# Patient Record
Sex: Female | Born: 1980 | Race: White | Hispanic: No | Marital: Single | Smoking: Current every day smoker
Health system: Southern US, Community
[De-identification: ages and names within clinical notes are randomized; demographics above are authoritative.]

## PROBLEM LIST (undated history)

## (undated) DIAGNOSIS — R569 Unspecified convulsions: Secondary | ICD-10-CM

## (undated) DIAGNOSIS — I639 Cerebral infarction, unspecified: Secondary | ICD-10-CM

## (undated) HISTORY — PX: OTHER SURGICAL HISTORY: SHX169

---
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
## 1999-12-21 ENCOUNTER — Emergency Department (HOSPITAL_COMMUNITY): Admission: EM | Admit: 1999-12-21 | Discharge: 1999-12-21 | Payer: Self-pay

## 2001-01-21 ENCOUNTER — Emergency Department (HOSPITAL_COMMUNITY): Admission: EM | Admit: 2001-01-21 | Discharge: 2001-01-21 | Payer: Self-pay | Admitting: Emergency Medicine

## 2001-03-28 ENCOUNTER — Encounter: Payer: Self-pay | Admitting: Obstetrics and Gynecology

## 2001-03-28 ENCOUNTER — Inpatient Hospital Stay (HOSPITAL_COMMUNITY): Admission: AD | Admit: 2001-03-28 | Discharge: 2001-03-28 | Payer: Self-pay | Admitting: Obstetrics

## 2001-04-06 ENCOUNTER — Other Ambulatory Visit: Admission: RE | Admit: 2001-04-06 | Discharge: 2001-04-06 | Payer: Self-pay | Admitting: Obstetrics & Gynecology

## 2001-06-26 ENCOUNTER — Ambulatory Visit (HOSPITAL_COMMUNITY): Admission: RE | Admit: 2001-06-26 | Discharge: 2001-06-26 | Payer: Self-pay | Admitting: Obstetrics & Gynecology

## 2001-06-26 ENCOUNTER — Encounter: Payer: Self-pay | Admitting: Obstetrics & Gynecology

## 2001-11-22 ENCOUNTER — Inpatient Hospital Stay (HOSPITAL_COMMUNITY): Admission: RE | Admit: 2001-11-22 | Discharge: 2001-11-26 | Payer: Self-pay | Admitting: Obstetrics & Gynecology

## 2002-04-21 ENCOUNTER — Emergency Department (HOSPITAL_COMMUNITY): Admission: EM | Admit: 2002-04-21 | Discharge: 2002-04-21 | Payer: Self-pay | Admitting: Emergency Medicine

## 2003-03-19 ENCOUNTER — Inpatient Hospital Stay (HOSPITAL_COMMUNITY): Admission: AD | Admit: 2003-03-19 | Discharge: 2003-03-19 | Payer: Self-pay | Admitting: *Deleted

## 2003-03-27 ENCOUNTER — Inpatient Hospital Stay (HOSPITAL_COMMUNITY): Admission: AD | Admit: 2003-03-27 | Discharge: 2003-03-27 | Payer: Self-pay | Admitting: Obstetrics & Gynecology

## 2003-06-03 ENCOUNTER — Inpatient Hospital Stay (HOSPITAL_COMMUNITY): Admission: AD | Admit: 2003-06-03 | Discharge: 2003-06-04 | Payer: Self-pay | Admitting: *Deleted

## 2003-08-12 ENCOUNTER — Ambulatory Visit (HOSPITAL_COMMUNITY): Admission: RE | Admit: 2003-08-12 | Discharge: 2003-08-12 | Payer: Self-pay | Admitting: Obstetrics & Gynecology

## 2003-10-01 ENCOUNTER — Inpatient Hospital Stay (HOSPITAL_COMMUNITY): Admission: AD | Admit: 2003-10-01 | Discharge: 2003-10-01 | Payer: Self-pay | Admitting: Obstetrics

## 2003-10-16 ENCOUNTER — Inpatient Hospital Stay (HOSPITAL_COMMUNITY): Admission: RE | Admit: 2003-10-16 | Discharge: 2003-10-19 | Payer: Self-pay | Admitting: Obstetrics & Gynecology

## 2003-10-31 ENCOUNTER — Emergency Department (HOSPITAL_COMMUNITY): Admission: EM | Admit: 2003-10-31 | Discharge: 2003-10-31 | Payer: Self-pay | Admitting: *Deleted

## 2003-11-10 ENCOUNTER — Emergency Department (HOSPITAL_COMMUNITY): Admission: EM | Admit: 2003-11-10 | Discharge: 2003-11-10 | Payer: Self-pay | Admitting: Emergency Medicine

## 2003-11-16 ENCOUNTER — Emergency Department (HOSPITAL_COMMUNITY): Admission: EM | Admit: 2003-11-16 | Discharge: 2003-11-16 | Payer: Self-pay | Admitting: Emergency Medicine

## 2003-11-17 ENCOUNTER — Inpatient Hospital Stay (HOSPITAL_COMMUNITY): Admission: RE | Admit: 2003-11-17 | Discharge: 2003-11-19 | Payer: Self-pay | Admitting: Orthopaedic Surgery

## 2005-06-18 ENCOUNTER — Inpatient Hospital Stay (HOSPITAL_COMMUNITY): Admission: EM | Admit: 2005-06-18 | Discharge: 2005-06-22 | Payer: Self-pay | Admitting: Emergency Medicine

## 2005-06-20 ENCOUNTER — Encounter (INDEPENDENT_AMBULATORY_CARE_PROVIDER_SITE_OTHER): Payer: Self-pay | Admitting: Cardiology

## 2005-06-22 ENCOUNTER — Ambulatory Visit: Payer: Self-pay | Admitting: Physical Medicine & Rehabilitation

## 2005-06-22 ENCOUNTER — Inpatient Hospital Stay (HOSPITAL_COMMUNITY)
Admission: RE | Admit: 2005-06-22 | Discharge: 2005-07-05 | Payer: Self-pay | Admitting: Physical Medicine & Rehabilitation

## 2005-08-12 ENCOUNTER — Ambulatory Visit: Payer: Self-pay | Admitting: Physical Medicine & Rehabilitation

## 2005-08-12 ENCOUNTER — Encounter
Admission: RE | Admit: 2005-08-12 | Discharge: 2005-11-10 | Payer: Self-pay | Admitting: Physical Medicine & Rehabilitation

## 2005-09-06 ENCOUNTER — Ambulatory Visit (HOSPITAL_COMMUNITY): Admission: RE | Admit: 2005-09-06 | Discharge: 2005-09-07 | Payer: Self-pay | Admitting: Cardiology

## 2005-09-22 ENCOUNTER — Ambulatory Visit: Payer: Self-pay | Admitting: Physical Medicine & Rehabilitation

## 2005-12-22 ENCOUNTER — Ambulatory Visit: Payer: Self-pay | Admitting: Physical Medicine & Rehabilitation

## 2005-12-22 ENCOUNTER — Encounter
Admission: RE | Admit: 2005-12-22 | Discharge: 2006-03-22 | Payer: Self-pay | Admitting: Physical Medicine & Rehabilitation

## 2006-03-21 ENCOUNTER — Ambulatory Visit: Payer: Self-pay | Admitting: Physical Medicine & Rehabilitation

## 2006-03-21 ENCOUNTER — Encounter
Admission: RE | Admit: 2006-03-21 | Discharge: 2006-06-19 | Payer: Self-pay | Admitting: Physical Medicine & Rehabilitation

## 2006-09-05 ENCOUNTER — Encounter
Admission: RE | Admit: 2006-09-05 | Discharge: 2006-12-04 | Payer: Self-pay | Admitting: Physical Medicine & Rehabilitation

## 2006-09-05 ENCOUNTER — Ambulatory Visit: Payer: Self-pay | Admitting: Physical Medicine & Rehabilitation

## 2006-11-15 ENCOUNTER — Ambulatory Visit: Payer: Self-pay | Admitting: Physical Medicine & Rehabilitation

## 2007-01-05 ENCOUNTER — Encounter
Admission: RE | Admit: 2007-01-05 | Discharge: 2007-04-05 | Payer: Self-pay | Admitting: Physical Medicine & Rehabilitation

## 2007-02-13 ENCOUNTER — Ambulatory Visit: Payer: Self-pay | Admitting: Physical Medicine & Rehabilitation

## 2007-04-12 ENCOUNTER — Ambulatory Visit: Payer: Self-pay | Admitting: Physical Medicine & Rehabilitation

## 2007-04-12 ENCOUNTER — Encounter
Admission: RE | Admit: 2007-04-12 | Discharge: 2007-04-16 | Payer: Self-pay | Admitting: Physical Medicine & Rehabilitation

## 2007-10-01 ENCOUNTER — Encounter
Admission: RE | Admit: 2007-10-01 | Discharge: 2007-10-01 | Payer: Self-pay | Admitting: Physical Medicine & Rehabilitation

## 2008-01-17 ENCOUNTER — Encounter
Admission: RE | Admit: 2008-01-17 | Discharge: 2008-01-17 | Payer: Self-pay | Admitting: Physical Medicine & Rehabilitation

## 2008-11-21 ENCOUNTER — Ambulatory Visit: Payer: Self-pay | Admitting: Family

## 2008-11-21 ENCOUNTER — Inpatient Hospital Stay (HOSPITAL_COMMUNITY): Admission: AD | Admit: 2008-11-21 | Discharge: 2008-11-21 | Payer: Self-pay | Admitting: Obstetrics & Gynecology

## 2008-11-25 ENCOUNTER — Emergency Department (HOSPITAL_COMMUNITY): Admission: EM | Admit: 2008-11-25 | Discharge: 2008-11-25 | Payer: Self-pay | Admitting: Emergency Medicine

## 2008-12-01 ENCOUNTER — Inpatient Hospital Stay (HOSPITAL_COMMUNITY): Admission: AD | Admit: 2008-12-01 | Discharge: 2008-12-01 | Payer: Self-pay | Admitting: Family Medicine

## 2009-06-16 ENCOUNTER — Ambulatory Visit: Payer: Self-pay | Admitting: Obstetrics and Gynecology

## 2009-06-16 ENCOUNTER — Inpatient Hospital Stay (HOSPITAL_COMMUNITY): Admission: AD | Admit: 2009-06-16 | Discharge: 2009-06-16 | Payer: Self-pay | Admitting: Obstetrics & Gynecology

## 2009-07-21 ENCOUNTER — Inpatient Hospital Stay (HOSPITAL_COMMUNITY): Admission: AD | Admit: 2009-07-21 | Discharge: 2009-07-21 | Payer: Self-pay | Admitting: Obstetrics and Gynecology

## 2009-08-05 ENCOUNTER — Encounter: Payer: Self-pay | Admitting: Physician Assistant

## 2009-08-05 ENCOUNTER — Ambulatory Visit: Payer: Self-pay | Admitting: Obstetrics and Gynecology

## 2009-08-05 LAB — CONVERTED CEMR LAB
Antibody Screen: NEGATIVE
Basophils Absolute: 0.1 10*3/uL (ref 0.0–0.1)
Basophils Relative: 0 % (ref 0–1)
Eosinophils Absolute: 0.3 10*3/uL (ref 0.0–0.7)
Eosinophils Relative: 3 % (ref 0–5)
HCT: 38.2 % (ref 36.0–46.0)
Hemoglobin: 13.4 g/dL (ref 12.0–15.0)
Hepatitis B Surface Ag: NEGATIVE
Lymphocytes Relative: 22 % (ref 12–46)
Lymphs Abs: 2.4 10*3/uL (ref 0.7–4.0)
MCHC: 35.1 g/dL (ref 30.0–36.0)
MCV: 84.7 fL (ref 78.0–100.0)
Monocytes Absolute: 0.6 10*3/uL (ref 0.1–1.0)
Monocytes Relative: 5 % (ref 3–12)
Neutro Abs: 7.8 10*3/uL — ABNORMAL HIGH (ref 1.7–7.7)
Neutrophils Relative %: 70 % (ref 43–77)
Platelets: 294 10*3/uL (ref 150–400)
RBC: 4.51 M/uL (ref 3.87–5.11)
RDW: 13.6 % (ref 11.5–15.5)
Rh Type: POSITIVE
Rubella: 24.7 intl units/mL — ABNORMAL HIGH
WBC: 11.2 10*3/uL — ABNORMAL HIGH (ref 4.0–10.5)

## 2009-08-14 ENCOUNTER — Ambulatory Visit (HOSPITAL_COMMUNITY): Admission: RE | Admit: 2009-08-14 | Discharge: 2009-08-14 | Payer: Self-pay | Admitting: Family Medicine

## 2009-08-27 ENCOUNTER — Ambulatory Visit: Payer: Self-pay | Admitting: Family Medicine

## 2009-09-17 ENCOUNTER — Ambulatory Visit: Payer: Self-pay | Admitting: Obstetrics and Gynecology

## 2009-10-01 ENCOUNTER — Ambulatory Visit (HOSPITAL_COMMUNITY): Admission: RE | Admit: 2009-10-01 | Discharge: 2009-10-01 | Payer: Self-pay | Admitting: Obstetrics & Gynecology

## 2009-10-08 ENCOUNTER — Ambulatory Visit: Payer: Self-pay | Admitting: Obstetrics & Gynecology

## 2009-10-08 LAB — CONVERTED CEMR LAB
Chlamydia, Swab/Urine, PCR: NEGATIVE
GC Probe Amp, Urine: NEGATIVE

## 2009-10-29 ENCOUNTER — Ambulatory Visit: Payer: Self-pay | Admitting: Obstetrics & Gynecology

## 2009-10-29 LAB — CONVERTED CEMR LAB
HCT: 37.8 % (ref 36.0–46.0)
Hemoglobin: 12.1 g/dL (ref 12.0–15.0)
MCHC: 32 g/dL (ref 30.0–36.0)
MCV: 91.1 fL (ref 78.0–100.0)
Platelets: 283 10*3/uL (ref 150–400)
RBC: 4.15 M/uL (ref 3.87–5.11)
RDW: 13.5 % (ref 11.5–15.5)
WBC: 13.4 10*3/uL — ABNORMAL HIGH (ref 4.0–10.5)

## 2009-11-09 ENCOUNTER — Ambulatory Visit: Payer: Self-pay | Admitting: Obstetrics & Gynecology

## 2009-11-11 ENCOUNTER — Ambulatory Visit (HOSPITAL_COMMUNITY): Admission: RE | Admit: 2009-11-11 | Discharge: 2009-11-11 | Payer: Self-pay | Admitting: Obstetrics and Gynecology

## 2009-11-23 ENCOUNTER — Ambulatory Visit: Payer: Self-pay | Admitting: Obstetrics & Gynecology

## 2009-12-07 ENCOUNTER — Ambulatory Visit: Payer: Self-pay | Admitting: Obstetrics & Gynecology

## 2009-12-07 LAB — CONVERTED CEMR LAB
Chlamydia, DNA Probe: NEGATIVE
GC Probe Amp, Genital: NEGATIVE

## 2009-12-08 ENCOUNTER — Encounter: Payer: Self-pay | Admitting: Obstetrics & Gynecology

## 2009-12-14 ENCOUNTER — Ambulatory Visit: Payer: Self-pay | Admitting: Obstetrics & Gynecology

## 2009-12-17 ENCOUNTER — Ambulatory Visit: Payer: Self-pay | Admitting: Obstetrics & Gynecology

## 2009-12-24 ENCOUNTER — Ambulatory Visit: Payer: Self-pay | Admitting: Obstetrics & Gynecology

## 2009-12-29 ENCOUNTER — Ambulatory Visit: Payer: Self-pay | Admitting: Obstetrics & Gynecology

## 2009-12-29 ENCOUNTER — Inpatient Hospital Stay (HOSPITAL_COMMUNITY): Admission: RE | Admit: 2009-12-29 | Discharge: 2010-01-01 | Payer: Self-pay | Admitting: Obstetrics & Gynecology

## 2010-02-09 ENCOUNTER — Inpatient Hospital Stay (HOSPITAL_COMMUNITY): Admission: AD | Admit: 2010-02-09 | Discharge: 2010-02-09 | Payer: Self-pay | Admitting: Obstetrics & Gynecology

## 2010-02-09 ENCOUNTER — Ambulatory Visit: Payer: Self-pay | Admitting: Obstetrics & Gynecology

## 2010-05-28 ENCOUNTER — Encounter
Admission: RE | Admit: 2010-05-28 | Discharge: 2010-07-14 | Payer: Self-pay | Source: Home / Self Care | Attending: Neurology | Admitting: Neurology

## 2010-10-09 LAB — POCT URINALYSIS DIP (DEVICE)
Bilirubin Urine: NEGATIVE
Glucose, UA: NEGATIVE mg/dL
Hgb urine dipstick: NEGATIVE
Ketones, ur: NEGATIVE mg/dL
Nitrite: NEGATIVE
Protein, ur: NEGATIVE mg/dL
Specific Gravity, Urine: 1.015 (ref 1.005–1.030)
Urobilinogen, UA: 0.2 mg/dL (ref 0.0–1.0)
pH: 7.5 (ref 5.0–8.0)

## 2010-10-11 LAB — POCT URINALYSIS DIP (DEVICE)
Bilirubin Urine: NEGATIVE
Glucose, UA: NEGATIVE mg/dL
Hgb urine dipstick: NEGATIVE
Hgb urine dipstick: NEGATIVE
Hgb urine dipstick: NEGATIVE
Ketones, ur: NEGATIVE mg/dL
Ketones, ur: NEGATIVE mg/dL
Nitrite: NEGATIVE
Protein, ur: NEGATIVE mg/dL
Protein, ur: 30 mg/dL — AB
Protein, ur: NEGATIVE mg/dL
Specific Gravity, Urine: 1.025 (ref 1.005–1.030)
Specific Gravity, Urine: 1.02 (ref 1.005–1.030)
Specific Gravity, Urine: >=1.03 (ref 1.005–1.030)
Urobilinogen, UA: 0.2 mg/dL (ref 0.0–1.0)
Urobilinogen, UA: 1 mg/dL (ref 0.0–1.0)
pH: 6.5 (ref 5.0–8.0)
pH: 5.5 (ref 5.0–8.0)

## 2010-10-11 LAB — CCBB MATERNAL DONOR DRAW

## 2010-10-11 LAB — CBC
HCT: 33 % — ABNORMAL LOW (ref 36.0–46.0)
HCT: 35.1 % — ABNORMAL LOW (ref 36.0–46.0)
Hemoglobin: 11.4 g/dL — ABNORMAL LOW (ref 12.0–15.0)
Hemoglobin: 12.1 g/dL (ref 12.0–15.0)
MCHC: 34.5 g/dL (ref 30.0–36.0)
MCHC: 34.5 g/dL (ref 30.0–36.0)
MCV: 87.2 fL (ref 78.0–100.0)
MCV: 87.4 fL (ref 78.0–100.0)
Platelets: 175 10*3/uL (ref 150–400)
Platelets: 187 10*3/uL (ref 150–400)
RBC: 3.78 MIL/uL — ABNORMAL LOW (ref 3.87–5.11)
RBC: 4.02 MIL/uL (ref 3.87–5.11)
RDW: 14 % (ref 11.5–15.5)
RDW: 14.3 % (ref 11.5–15.5)
WBC: 10.1 10*3/uL (ref 4.0–10.5)
WBC: 10.4 10*3/uL (ref 4.0–10.5)

## 2010-10-11 LAB — RPR: RPR Ser Ql: NONREACTIVE

## 2010-10-12 LAB — POCT URINALYSIS DIP (DEVICE)
Hgb urine dipstick: NEGATIVE
Hgb urine dipstick: NEGATIVE
Ketones, ur: NEGATIVE mg/dL
Nitrite: NEGATIVE
Nitrite: NEGATIVE
Protein, ur: 30 mg/dL — AB
Urobilinogen, UA: 0.2 mg/dL (ref 0.0–1.0)
pH: 6.5 (ref 5.0–8.0)
pH: 8 (ref 5.0–8.0)

## 2010-10-13 LAB — POCT URINALYSIS DIP (DEVICE)
Bilirubin Urine: NEGATIVE
Glucose, UA: NEGATIVE mg/dL
Hgb urine dipstick: NEGATIVE
Hgb urine dipstick: NEGATIVE
Nitrite: NEGATIVE
Nitrite: NEGATIVE
Protein, ur: 30 mg/dL — AB
Protein, ur: NEGATIVE mg/dL
Protein, ur: NEGATIVE mg/dL
Specific Gravity, Urine: 1.02 (ref 1.005–1.030)
Urobilinogen, UA: 0.2 mg/dL (ref 0.0–1.0)
Urobilinogen, UA: 0.2 mg/dL (ref 0.0–1.0)
Urobilinogen, UA: 0.2 mg/dL (ref 0.0–1.0)
pH: 6 (ref 5.0–8.0)
pH: 7 (ref 5.0–8.0)
pH: 7 (ref 5.0–8.0)

## 2010-10-17 LAB — POCT URINALYSIS DIP (DEVICE)
Nitrite: NEGATIVE
Protein, ur: NEGATIVE mg/dL
Urobilinogen, UA: 0.2 mg/dL (ref 0.0–1.0)
pH: 7 (ref 5.0–8.0)

## 2010-10-25 LAB — CBC
HCT: 36.9 % (ref 36.0–46.0)
Hemoglobin: 12.7 g/dL (ref 12.0–15.0)
RDW: 12.9 % (ref 11.5–15.5)
WBC: 11.3 10*3/uL — ABNORMAL HIGH (ref 4.0–10.5)

## 2010-10-25 LAB — URINE MICROSCOPIC-ADD ON

## 2010-10-25 LAB — URINALYSIS, ROUTINE W REFLEX MICROSCOPIC
Glucose, UA: NEGATIVE mg/dL
Ketones, ur: NEGATIVE mg/dL
Nitrite: NEGATIVE
Protein, ur: NEGATIVE mg/dL
Urobilinogen, UA: 0.2 mg/dL (ref 0.0–1.0)

## 2010-10-25 LAB — GC/CHLAMYDIA PROBE AMP, GENITAL
Chlamydia, DNA Probe: POSITIVE — AB
GC Probe Amp, Genital: NEGATIVE

## 2010-10-25 LAB — URINE CULTURE

## 2010-10-25 LAB — WET PREP, GENITAL: Yeast Wet Prep HPF POC: NONE SEEN

## 2010-10-27 LAB — POCT PREGNANCY, URINE: Preg Test, Ur: POSITIVE

## 2010-10-27 LAB — URINALYSIS, ROUTINE W REFLEX MICROSCOPIC
Hgb urine dipstick: NEGATIVE
Nitrite: NEGATIVE
Specific Gravity, Urine: 1.01 (ref 1.005–1.030)
Urobilinogen, UA: 0.2 mg/dL (ref 0.0–1.0)
pH: 8 (ref 5.0–8.0)

## 2010-10-27 LAB — WET PREP, GENITAL
Trich, Wet Prep: NONE SEEN
Yeast Wet Prep HPF POC: NONE SEEN

## 2010-12-07 NOTE — Procedures (Signed)
[name], [name]                 ACCOUNT NO.:  [id number]   MEDICAL RECORD NO.:  [id number]          PATIENT TYPE:  REC   LOCATION:  TPC                          FACILITY:  [facility]   PHYSICIAN:  Ranelle Oyster, M.D.DATE OF BIRTH:  01-09-1981   DATE OF PROCEDURE:  02/14/2007  DATE OF DISCHARGE:                               OPERATIVE REPORT   PROCEDURE:  Botox injection, 42.11.   DESCRIPTION OF PROCEDURE:  After informed consent and preparation with  isopropyl alcohol, we used EMG isolation to inject the right forearm and  the superficial and deep finger flexor compartments.  We also injected  the pronator teres.  A total of 100 units was injected diluted in 100  units to 2 mL preservative free saline ratio.  The patient tolerated the  procedure well without complications.  I will see her back in two months  for follow up and consideration for further treatment.      Ranelle Oyster, M.D.  Electronically Signed     ZTS/MEDQ  D:  02/14/2007 13:12:15  T:  02/14/2007 22:59:26  Job:  [id number]
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
## 2010-12-10 NOTE — H&P (Signed)
[name], [name]                           ACCOUNT NO.:  [id number]   MEDICAL RECORD NO.:  [id number]                   PATIENT TYPE:  INP   LOCATION:  NA                                   FACILITY:  [facility]   PHYSICIAN:  Roseanna Rainbow, M.D.         DATE OF BIRTH:  Jul 08, 1981   DATE OF ADMISSION:  DATE OF DISCHARGE:                                HISTORY & PHYSICAL   CHIEF COMPLAINT:  The patient is a 30 year old gravida 3, para 2 with  estimated date of confinement October 25, 2003, with an intrauterine pregnancy  at 38-5/7 weeks who presents for elective repeat Cesarean section delivery.   HISTORY OF PRESENT ILLNESS:  As above.  The patient has a history of a  previous Cesarean section delivery and declines a trial of labor.  Antepartum problems with questionable epilepsy with grand mal seizures,  anxiety disorder, history of depression.  A second trimester ultrasound  demonstrated a placenta previa which resolved, and also a unilateral choroid  plexus cyst that resolved.  Most recent ultrasound on August 12, 2003  estimated fetal weight percentile between the 50th and 75th percentile with  normal amniotic fluid index.  The patient's dating is consistent with a 19-  week ultrasound.   LABORATORY DATA:  RPR nonreactive, hepatis B surface antigen negative,  hemoglobin 11.5, hematocrit 33.2, blood type AB positive, antibody screen  negative.   OBSTETRICAL/GYNECOLOGICAL HISTORY:  She is status post attempted vaginal  delivery followed by an emergency Cesarean section delivery.   PAST MEDICAL HISTORY:  See above.  She does have a history of migraine  headaches.   PAST SURGICAL HISTORY:  See above.   CURRENT MEDICATIONS:  Prenatal vitamins and Lexapro.   SOCIAL HISTORY:  Patient is single.  She denies any alcohol or street drug  use.   ALLERGIES:  No known drug allergies.   FAMILY HISTORY:  Remarkable for myocardial infarction.   PHYSICAL EXAMINATION:  VITAL SIGNS:   Blood pressure 98/56, pulse 96,  temperature 97.4.  GENERAL:  Well-developed, well-nourished.  LUNGS:  Clear to auscultation bilaterally.  HEART:  Regular rate and rhythm.  ABDOMEN:  Gravid.  PELVIC:  Examination is deferred.  EXTREMITIES:  Nontender.   ASSESSMENT:  1. Intrauterine pregnancy at term.  2. History of previous Cesarean section delivery, declines trial of labor.  3. Multiple medical problems.   PLAN:  Admission for elective repeat Cesarean delivery.  Possible neurology  consultation.                                               Roseanna Rainbow, M.D.    Judee Clara  D:  10/15/2003  T:  10/15/2003  Job:  [id number]

## 2010-12-10 NOTE — Assessment & Plan Note (Signed)
[name] is back after Botox injections. She found that they were not all  that helpful. She tried Chantix for smoking cessation and this was not  of any benefit either. Apparently, her mother and family still smoke and  she has been unable to quit. She is in therapy at [facility]. They are working on coordination and fine motor movements with  her. She complains of some neck pain when performing some activities  such as driving and using the right hand. She also had some concerns  about asymmetries in her two shoulders.   Pain is a 0/10 today. Denies any other issues. Apparently, she is back  with her fiance/ex-husband. He is with her in the office today. The  patient denies any other specific problems today.   REVIEW OF SYSTEMS:  Notable for numbness in the hand and arm. Full  review is in the written health and history section in the chart.   SOCIAL HISTORY:  As noted above.   PHYSICAL EXAMINATION:  Blood pressure 158/71, pulse 103, respiratory  rate is 18. She is satting 99% on room air. The patient is pleasant,  alert and oriented x3. Affect is bright and appropriate.  Gait is stable.  Right hand still is uncoordinated and has occasional tone, although I  think she has more difficulty with motor planning and apraxia than  anything else.  She continues to have some word finding issues, although she is able to  express herself for the most part with extra time and persistence.  HEART: Tachycardic.  CHEST: Is clear.  ABDOMEN: Soft and nontender.  There are some mild asymmetries right to left due to muscle atrophy in  the affected limbs.   ASSESSMENT:  1. Left MCA stroke.  2. Status post patent foramen ovale repair.   PLAN:  1. Continue outpatient OT as I think this is important to working on      coordination and movement of the right upper extremity. I think she      has more apraxia than tone in the arm and she really needs to use      the arm more in her  daily activities.  2. Discussed smoking cessation once again. I am not going to prescribe      her anything further. She needs to spend time with those who are      not smoking.  3. I will see her back in six months time.      Ranelle Oyster, M.D.  Electronically Signed     ZTS/MedQ  D:  04/16/2007 13:04:10  T:  04/16/2007 19:08:12  Job #:  [id number]

## 2010-12-10 NOTE — Op Note (Signed)
[name], [name]                           ACCOUNT NO.:  [id number]   MEDICAL RECORD NO.:  [id number]                   PATIENT TYPE:  OIB   LOCATION:  5027                                 FACILITY:  [facility]   PHYSICIAN:  Mark C. Ophelia Charter, M.D.                 DATE OF BIRTH:  09-Nov-1980   DATE OF PROCEDURE:  11/17/2003  DATE OF DISCHARGE:                                 OPERATIVE REPORT   PREOPERATIVE DIAGNOSIS:  Left trimalleolar ankle fracture.   POSTOPERATIVE DIAGNOSIS:  Left trimalleolar ankle fracture.   OPERATION PERFORMED:  Open reduction internal fixation medial and lateral  malleolus, syndesmotic screw placement.   SURGEON:  Mark C. Ophelia Charter, M.D.   ASSISTANT:  Sandrea Matte, P.A.   ANESTHESIA:  General orotracheal anesthesia.   TOURNIQUET TIME:  30 minutes.   INDICATIONS FOR PROCEDURE:  This 30 year old postpartum female tripped on  the stairs suffering fracture dislocation of the right ankle.  Syndesmosis  was wide and she had a fibular fracture that was 7 to 8 cm above the mortise  consistent with pronation external rotation injury.   DESCRIPTION OF PROCEDURE:  After standard prepping and draping, preoperative  Ancef, medial incision was made C-shaped anterior to the medial malleolus.  Fracture was distracted, irrigated and then attention was turned lateral.  Fibular fracture was reduced anatomically held with Verbrugge clamp.  7-hole  one third tibial plate was placed.  Distal most screw was unicortical.  Syndesmotic screw was placed in the second hole from the bottom after medial  malleolus was anatomically fixed and two 4-0 lag screws were placed.  After  irrigation with saline solution, checking under fluoroscopy, subcutaneous  tissue reapproximated with 2-0, skin closure staples.  Postoperative short  leg splint.  Instrument count and needle count was correct.  The patient  tolerated the procedure well.                                               Mark C.  Ophelia Charter, M.D.    MCY/MEDQ  D:  11/17/2003  T:  11/18/2003  Job:  [id number]

## 2010-12-10 NOTE — Cardiovascular Report (Signed)
[name], [name]                 ACCOUNT NO.:  [id number]   MEDICAL RECORD NO.:  [id number]          PATIENT TYPE:  OIB   LOCATION:  2899                         FACILITY:  [facility]   PHYSICIAN:  Cristy Hilts. Jacinto Halim, MD       DATE OF BIRTH:  28-Dec-1980   DATE OF PROCEDURE:  09/06/2005  DATE OF DISCHARGE:                              CARDIAC CATHETERIZATION   REFERRING PHYSICIANS:  1.  Dr. Delia Heady.  2.  Dr. Harold Hedge from [place], [place].   PROCEDURES PERFORMED:  1.  Intracardiac echocardiogram.  2.  Closure of the patent foramen ovale with a 28-mm CardioSEAL septal      occluder under intracardiac echocardiographic guidance.   INDICATION:  [name] is a 30 year old female who has a history of  stroke presumed to be cardioembolic.  This was on June 18, 2005.  She  underwent TEE which revealed a large PFO with atrial septal aneurysm.  Given  this, she was brought to the catheterization lab for evaluation of PFO for  possible PFO closure after having obtained informed consent.   INTRACARDIAC ECHOCARDIOGRAM PROCEDURAL DATA:  Left atrium:  The left atrium  is normal.   Left ventricle:  The left ventricle is normal.   Right atrium:  The right atrium is normal.   Right ventricle:  The right ventricle is normal.   Mitral valve:  The mitral valve is normal.   Tricuspid valve:  The tricuspid valve is normal.   Aortic valve:  The aortic valve is normal.   Pulmonary valve:  The pulmonary valve is grossly normal.   Intra-atrial septum:  The intra-atrial septum is mildly aneurysmal with the  presence of a PFO with spontaneous right-to-left shunting.   INTERVENTIONAL DATA:  Successful closure of the PFO under ICE guidance with  deployment of a 28-mm CardioSEAL septal occluder with complete closure of  the PFO.  Post-deployment color and double-contrast injection revealed no  right-to-left shunting.   RECOMMENDATION:  1.  The patient will be continued on  aspirin and Plavix for at least a      period of 3 months.  She will continue on aspirin indefinitely.  She      will need endocarditis prophylaxis for a period of 6 months.  2.  She will need an echocardiogram in the next 1-2 days and also in another      month to evaluate for the device position, thrombus or pericardial      effusion.  She will be observed overnight and if she remains stable, she      can be discharged home in the morning.   TECHNIQUE OF PROCEDURE:  Under the usual sterile precautions using a 9-  French left femoral venous access and an 8-French right femoral venous  access, an intracardiac echo probe was advanced through the left femoral  vein into the right atrium and intracardiac echocardiogram was performed.  Images were obtained and the data was carefully analyzed.  A double bubble  study was also performed to confirm the presence of right-to-left shunting.   TECHNIQUE  OF PATENT FORAMEN OVALE CLOSURE:  A multipurpose A2 catheter was  advanced through the right femoral vein through an 8-French right femoral  venous access sheath and the PFO was easily crossed over.  This catheter was  advanced over a 0.025-inch Amplatz stiff wire with soft tip.  The tip of the  wire was carefully positioned in the left lower pulmonary vein.  The A2  catheter was then withdrawn and a sizing balloon was advanced over the  Amplatz wire and after carefully positioning the balloon, the size of the  PFO was carefully measured, both by intracardiac echo and by fluoroscopy.  Then this balloon was withdrawn.  During the procedure, a total of 5500  units of intravenous heparin were administered and the ACT was maintained at  greater than 250.  Then once the size was confirmed, we decided to proceed  with implantation of a 28-mm CardioSEAL septal occluder.  The septal  occluder was carefully clamped and advanced through the 11-French [facility]  sheath.  The 8-French right femoral venous sheath  had already been exchanged  to an 11-French sheath and the tip of the Mullins sheath was carefully  positioned into the left atrium.  The CardioSEAL septal occluder was then  carefully advanced through this [facility] sheath and the left atrial side of  the device was deployed with careful pulling back and abutting the intra-  atrial septum, the right atrial side was deployed.  After confirming the  position of the device, the device was released after performing bubble  double-contrast study and also color study.  Excellent results were noted.  The patient tolerated the procedure well.  Then the sheath was pulled out  and sutured in place   DICTATION ENDED AT THIS POINT.      Cristy Hilts. Jacinto Halim, MD  Electronically Signed     JRG/MEDQ  D:  09/06/2005  T:  09/07/2005  Job:  [id number]   cc:   Delight Hoh MD   Pramod P. Pearlean Brownie, MD  Fax: [phone]

## 2010-12-10 NOTE — Discharge Summary (Signed)
[name], [name]                 ACCOUNT NO.:  [id number]   MEDICAL RECORD NO.:  [id number]          PATIENT TYPE:  OIB   LOCATION:  6531                         FACILITY:  [facility]   PHYSICIAN:  Vonna Kotyk R. Jacinto Halim, MD       DATE OF BIRTH:  Sep 10, 1980   DATE OF ADMISSION:  09/06/2005  DATE OF DISCHARGE:  09/07/2005                                 DISCHARGE SUMMARY   DISCHARGE DIAGNOSES:  1.  Status post patent foramen ovale closure secondary to high risk of      cardioembolic cerebrovascular accident.  2.  Status post recent left middle cerebral artery stroke.  3.  Ongoing tobacco abuse.  4.  Anxiety disorder with questionable episodes of seizures as a child.   This is a 30 year old Caucasian female who is a patient of Dr. Pearlean Brownie and  recently suffered a left middle cerebral artery stroke with altered mental  status and global aphasia and she underwent transesophageal echocardiogram  as part of her workup to rule out cardioembolic event, and it showed patent  foramen ovale.  She was referred to Dr. Jacinto Halim, who saw her in our office on  August 24, 2005, and recommended to undergo PFO closure.  For that, the  patient was admitted to [facility] through short stay unit.   Hospital procedure:  Patent foramen ovale closure performed with 28 mm  CardioSeal septal occluder, and the patient tolerated the procedure well and  she was transferred to the unit in stable condition.   The next morning her left groin developed mild ooze and it was injected with  lidocaine and epinephrine.  Dr. Jacinto Halim assessed the patient prior to  discharge and her vital signs were stable and she was considered to be okay  for discharge home if her groin remained stable after ambulation.   DISCHARGE MEDICATIONS:  1.  Plavix 75 mg daily.  2.  Aspirin 81 mg daily.   DISCHARGE ACTIVITY:  Was recommended to increase activity slowly, do not  drive, do not lift greater than five pounds the first three days after  intervention.   DISCHARGE FOLLOW-UP:  She is to have a 2-D echo today at 1 o'clock in our  office and also the patient will need an endocarditis prophylaxis for six  months after procedure.   She is to follow up with Dr. Pearlean Brownie in two or three weeks after PFO closure.      Raymon Mutton, P.A.      Cristy Hilts. Jacinto Halim, MD  Electronically Signed    MK/MEDQ  D:  09/07/2005  T:  09/07/2005  Job:  [id number]   cc:   Pramod P. Pearlean Brownie, MD  Fax: [phone]
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
## 2010-12-10 NOTE — Consult Note (Signed)
[name], [name]              ACCOUNT NO.:  [id number]   MEDICAL RECORD NO.:  [id number]          PATIENT TYPE:  INP   LOCATION:  3019                         FACILITY:  [facility]   PHYSICIAN:  Pramod P. Pearlean Brownie, MD    DATE OF BIRTH:  [birth year]   DATE OF CONSULTATION:  06/22/2005  DATE OF DISCHARGE:                                   CONSULTATION   REQUESTING PHYSICIAN:  Rehab MD.   Transcranial Doppler Bubble Study Consultation   REASON FOR CONSULTATION:  Stroke. Evaluate for patent foramen ovale.   HISTORY OF PRESENT ILLNESS:  [name] is a 30 year old Caucasian lady who  was admitted on June 18, 2005, with sudden onset of altered mental  status and aphasia, with dense right hemiparesis.  The patient's CT scan as  well as subsequent MRI scan showed a large left middle cerebral artery  infarction involving the cortical territory and spreading the basal ganglia.  MRA showed distal stenosis of the left middle cerebral artery which was  likely partially recannulized embolus. Subsequent hospital stroke risk  stratification workup has revealed no obvious extracranial stenosis on  ultrasound or MRA. A 2-D echocardiogram has been unremarkable. A  transesophageal echocardiogram has shown an atrial septal aneurysm with a  large patent foramen ovale. A transcranial Doppler bubble study would be  beneficial to categorize the intracardiac shunt.   PAST MEDICAL HISTORY:  Significant for remote history of seizures. No other  medical problems.   PAST SURGICAL HISTORY:  Right foot surgery for traumatic injury.   MEDICATION ALLERGIES:  None.   HOME MEDICATIONS:  None.   SOCIAL HISTORY:  She lives in [place]. She is a single mother. She has  three children. She smokes one pack per day, but does not do drugs or  alcohol.   PHYSICAL EXAMINATION:  GENERAL: A pleasant young Caucasian lady who is not  in distress.  VITAL SIGNS: Afebrile, pulse rate 72 per minute and regular,  respiratory  rate 16 per minute. Distal pulses are well felt.  CARDIAC: No murmur or gallop.  NEUROLOGIC: The patient is pleasant, awake, alert, cooperative. She is  globally aphasic. She does not speak any words. She does follow some  gestures. She does visually track. She has left gaze preference, but can  look to the right. She does not blink to visual  threat on the right. Right  facial weakness is noted in the lower half. There is slight hemiplegia. 0/5  strength in upper extremities and 1 to 2 out 5 strength in the right lower  extremity. She has good purposeful movements on the left side.   DATA REVIEWED:  As stated above.  MRI findings and transesophageal  echocardiogram results reviewed. Hypercoagulable panel is negative.  Vasculitic workup is negative.   IMPRESSION:  A 30 year old lady with left middle cerebral artery infarction  due to embolism from an outside source. A transcranial Doppler bubble study  would be appropriate to categorize patent foramen ovale.   A transcranial Doppler bubble study was performed at the bedside. Both  middle cerebral arteries were __________ using a headset. IV line  had been  previously inserted in the right forearm. __________.  saline injection at  rest resulted in multiple high-density transient signals of HITS which were  noted within 10 seconds of the injection indicated with a frank curtain  effect which lasted several seconds.   IMPRESSION:  Strongly positive transcranial Doppler bubble study indicative  of a large right to left intracardiac shunt at rest. Such a large PFO,  particularly in association with an atrioseptal aneurysm, and no other  obvious etiology for the patient's middle cerebral artery clot. Would  benefit from elective endovascular closure. This will be discussed with the  patient's mother who is not available at the bedside. Medical treatment  options of antiplatelet therapy versus warfarin have recently been  evaluated  and prospective trials and no specific benefit of Coumadin has been found  over antiplatelet therapy. Hence, agree with continuing Aggrenox for  secondary stroke prevention for now. The patient's left middle cerebral  artery velocities on transcranial Doppler today were elevated at 168 which  indicates moderate stenosis. This also needs to be followed electively with  serial TCDs as well as MRAs. If she has recurrent symptoms with her  underlying residual MCA stenosis may consider angioplasty stenting also  electively in the future.  Again, the patient's family is not available at  the bedside to discuss this. I would like to follow the patient electively  in the office in four weeks for discussion of further treatment both for  endovascular repair for closure as well as elective follow-up and treatment  for a left MCA stenosis.           ______________________________  [name]. Pearlean Brownie, MD     PPS/MEDQ  D:  06/22/2005  T:  06/22/2005  Job:  [id number]   cc:   Erick Colace, M.D.  Fax: [phone]

## 2011-11-25 IMAGING — US US OB FOLLOW-UP
1 series · 14 of 28 positions shown · non-contrast
Comparison: none

OBSTETRICAL ULTRASOUND:
 This ultrasound exam was performed in the [HOSPITAL] Ultrasound Department.  The OB US report was generated in the AS system, and faxed to the ordering physician.  This report is also available in [HOSPITAL]?s Access[employer] and in [REDACTED] PACS.

[Series 1: us ob follow up · 14 of 29 slices shown]
[im 2/29]
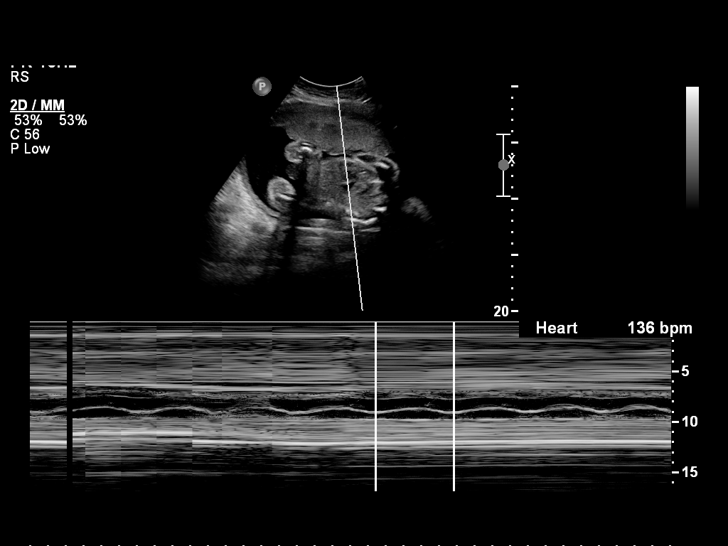
[im 4/29]
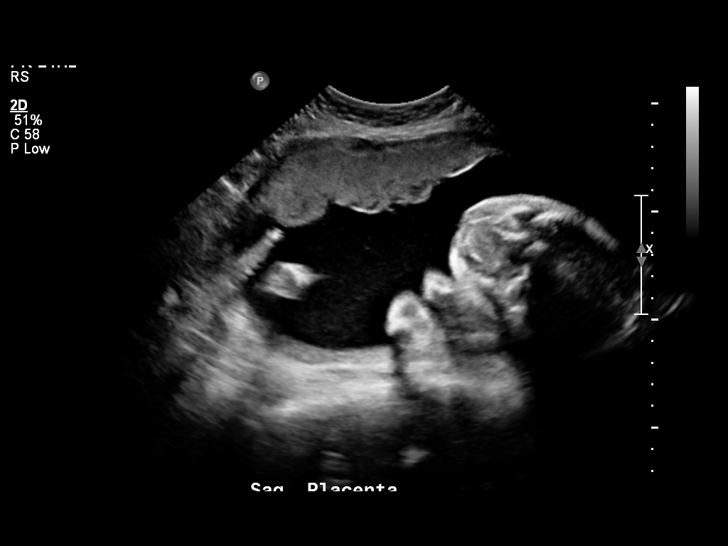
[im 6/29]
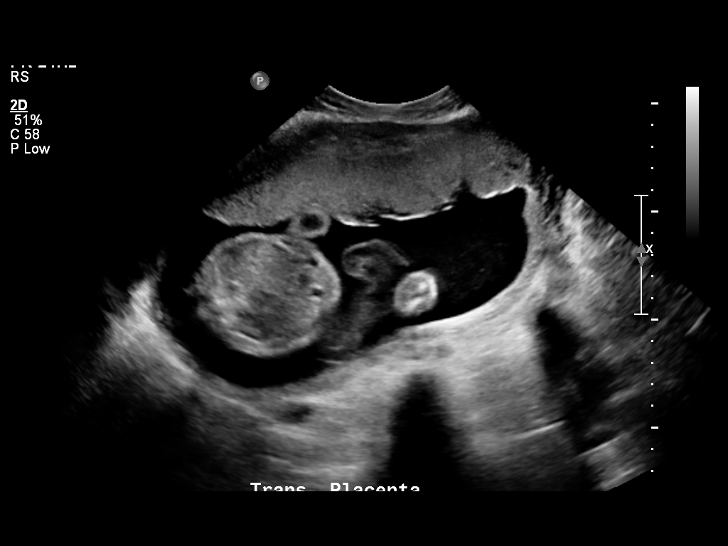
[im 8/29]
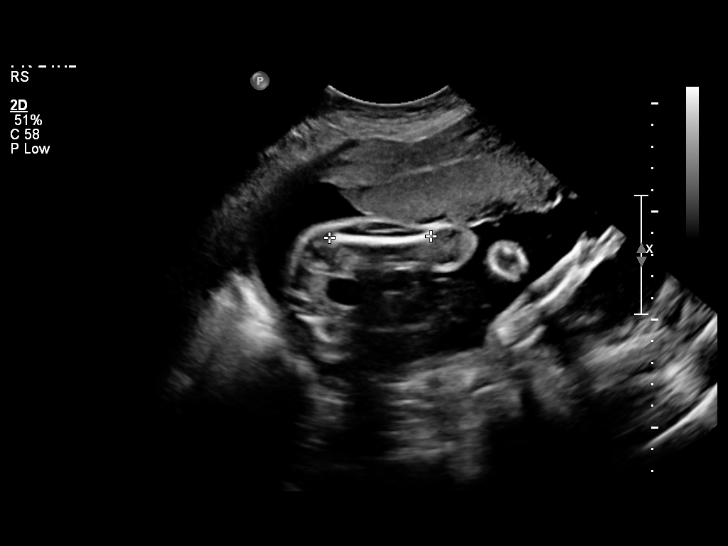
[im 10/29]
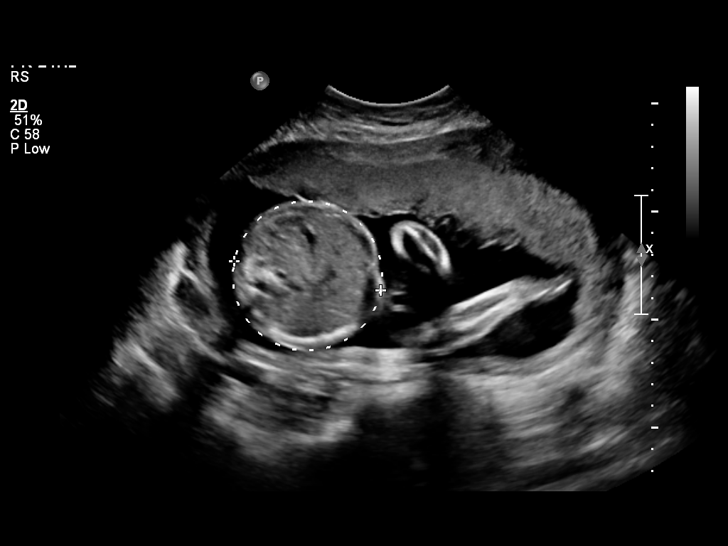
[im 12/29]
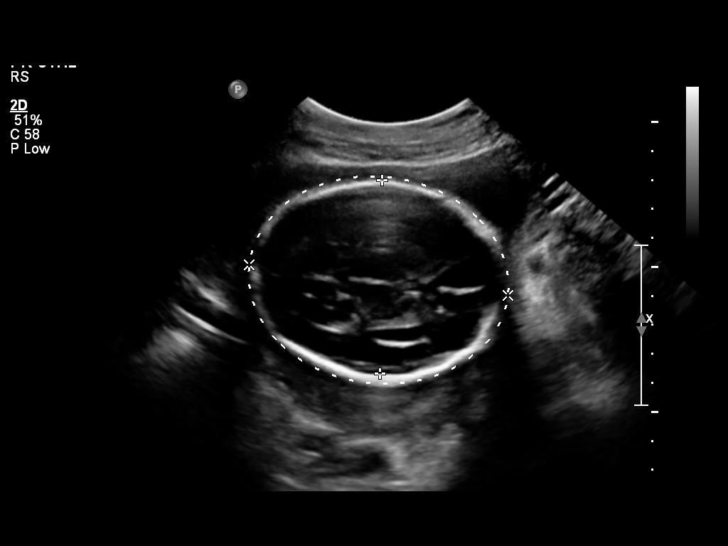
[im 14/29]
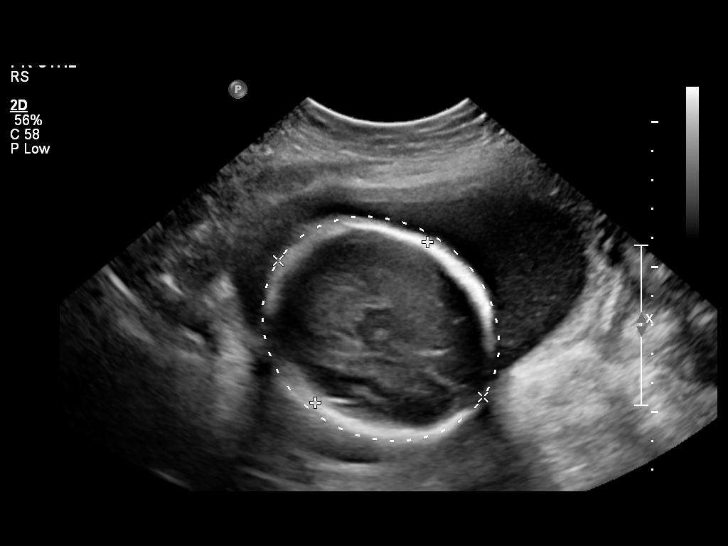
[im 16/29]
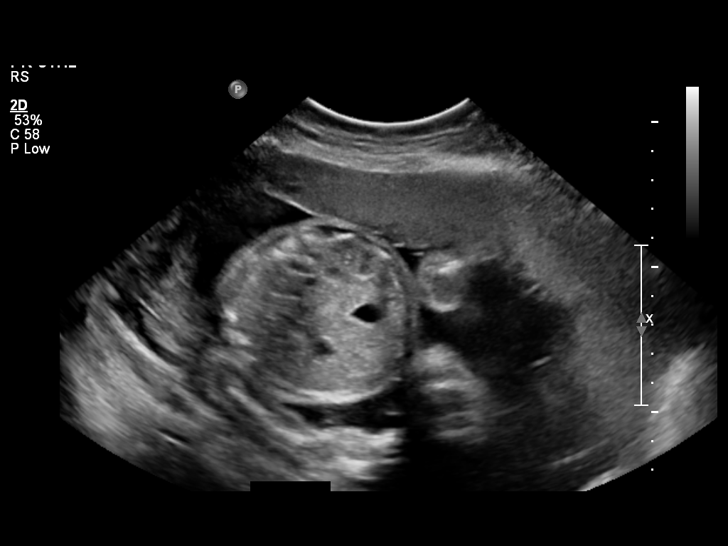
[im 18/29]
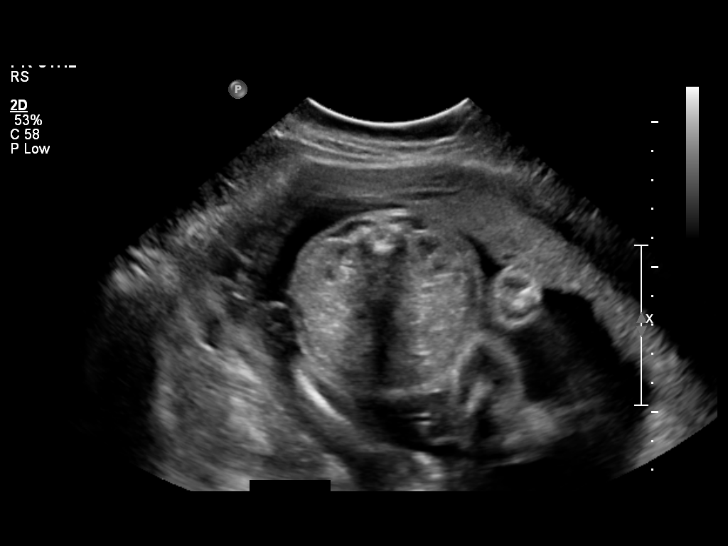
[im 20/29]
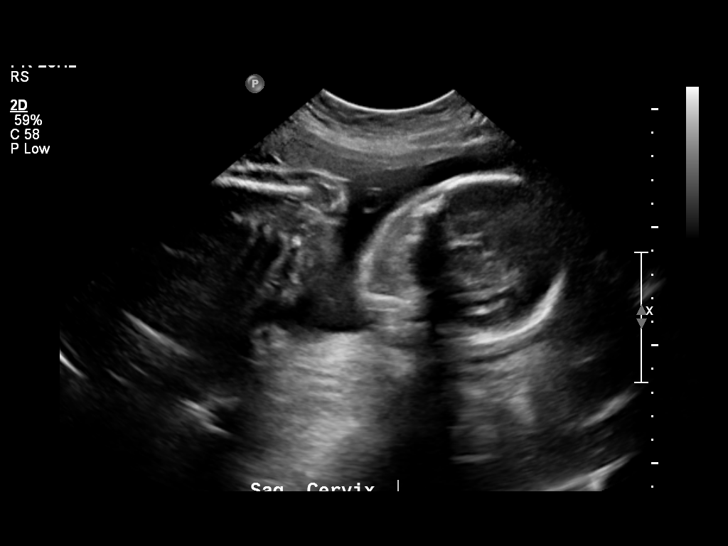
[im 22/29]
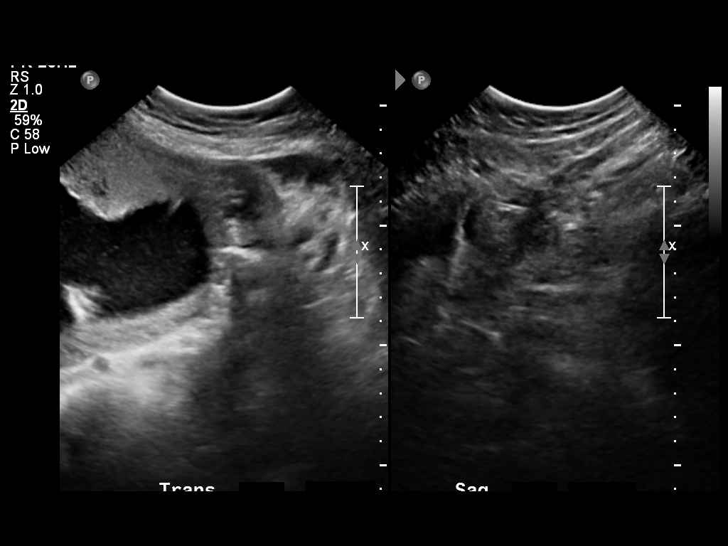
[im 24/29]
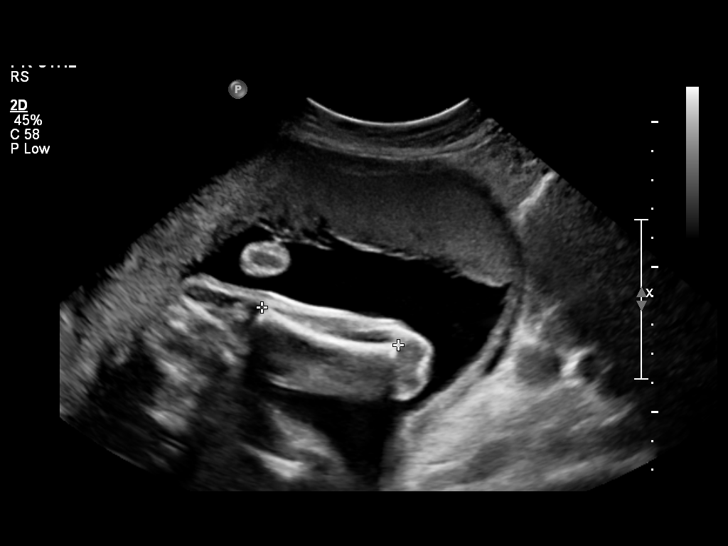
[im 26/29]
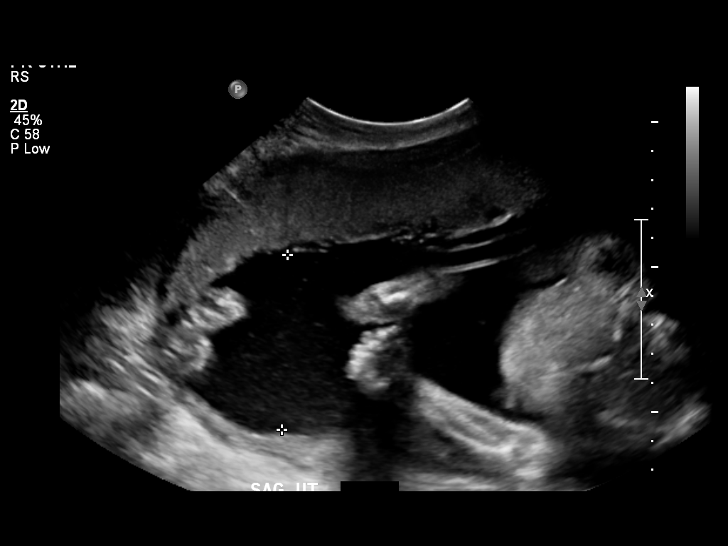
[im 29/29]
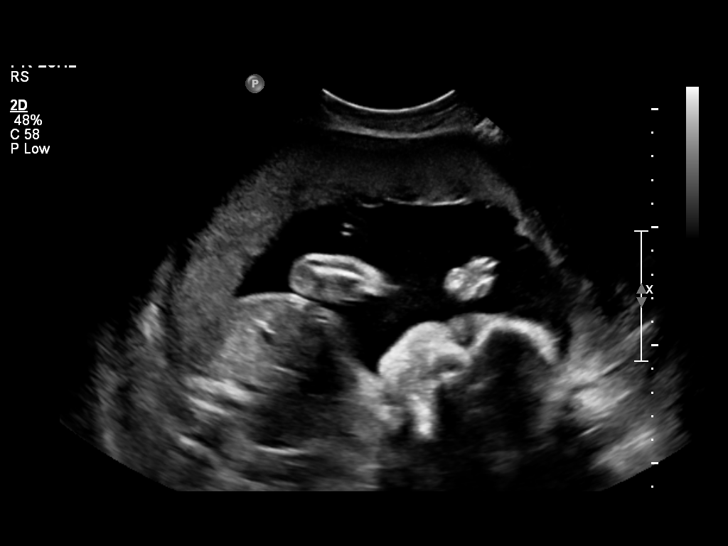

[14 of 28 positions shown; findings below may reference images not displayed]

IMPRESSION: See AS Obstetric US report.

## 2011-11-27 ENCOUNTER — Emergency Department (HOSPITAL_COMMUNITY): Payer: Medicare Other

## 2011-11-27 ENCOUNTER — Encounter (HOSPITAL_COMMUNITY): Payer: Self-pay | Admitting: Emergency Medicine

## 2011-11-27 ENCOUNTER — Emergency Department (HOSPITAL_COMMUNITY)
Admission: EM | Admit: 2011-11-27 | Discharge: 2011-11-27 | Disposition: A | Discharge status: Home / Self Care | Payer: Medicare Other | Attending: Emergency Medicine | Admitting: Emergency Medicine

## 2011-11-27 DIAGNOSIS — S060X0A Concussion without loss of consciousness, initial encounter: Secondary | ICD-10-CM | POA: Insufficient documentation

## 2011-11-27 DIAGNOSIS — G40909 Epilepsy, unspecified, not intractable, without status epilepticus: Secondary | ICD-10-CM | POA: Insufficient documentation

## 2011-11-27 DIAGNOSIS — R110 Nausea: Secondary | ICD-10-CM | POA: Insufficient documentation

## 2011-11-27 DIAGNOSIS — R51 Headache: Secondary | ICD-10-CM | POA: Insufficient documentation

## 2011-11-27 DIAGNOSIS — Z8673 Personal history of transient ischemic attack (TIA), and cerebral infarction without residual deficits: Secondary | ICD-10-CM | POA: Insufficient documentation

## 2011-11-27 DIAGNOSIS — M542 Cervicalgia: Secondary | ICD-10-CM | POA: Insufficient documentation

## 2011-11-27 HISTORY — DX: Cerebral infarction, unspecified: I63.9

## 2011-11-27 HISTORY — DX: Unspecified convulsions: R56.9
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
## 2011-11-27 NOTE — ED Notes (Signed)
Paper discharge was given to the patient.

## 2011-11-27 NOTE — ED Notes (Signed)
Pt presented to the ER with c/o headache x 5 days that has got progressively worse. Pt denies any fever, no dizziness, no blurry vision. Pt is NAD

## 2012-01-05 IMAGING — US US OB FOLLOW-UP
1 series · 14 of 28 positions shown · non-contrast
Comparison: none

OBSTETRICAL ULTRASOUND:
 This ultrasound exam was performed in the [HOSPITAL] Ultrasound Department.  The OB US report was generated in the AS system, and faxed to the ordering physician.  This report is also available in [HOSPITAL]?s [employer] and in [REDACTED] PACS.

[Series 1: us ob follow up · 14 of 41 slices shown]
[im 2/41]
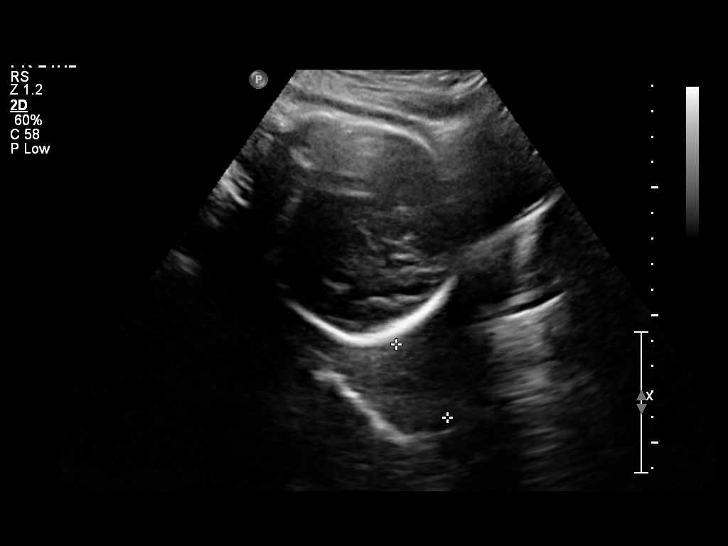
[im 5/41]
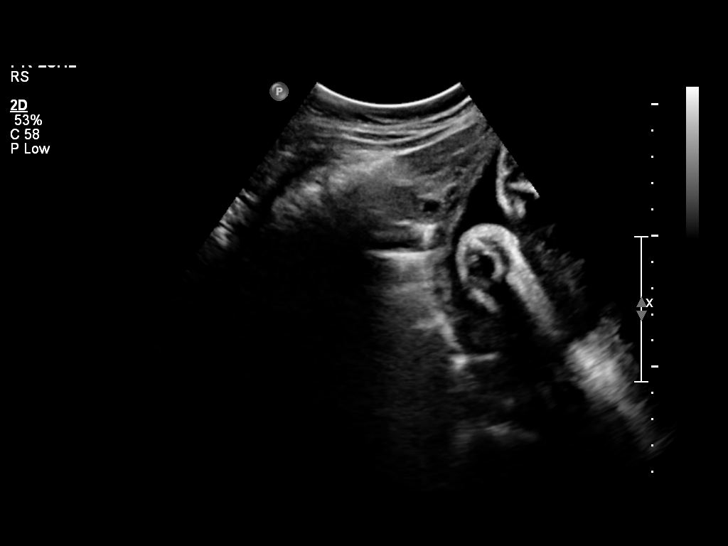
[im 8/41]
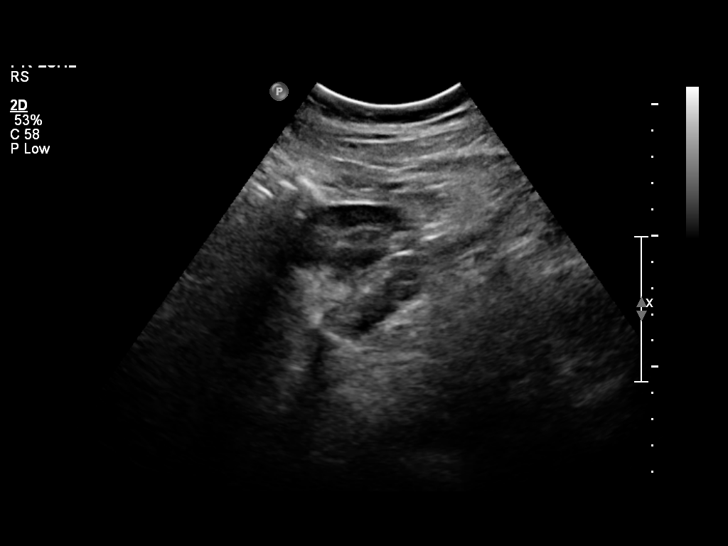
[im 11/41]
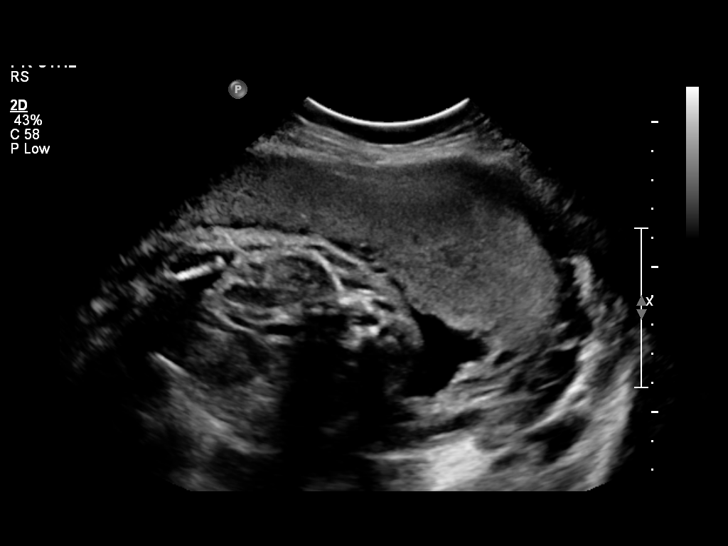
[im 14/41]
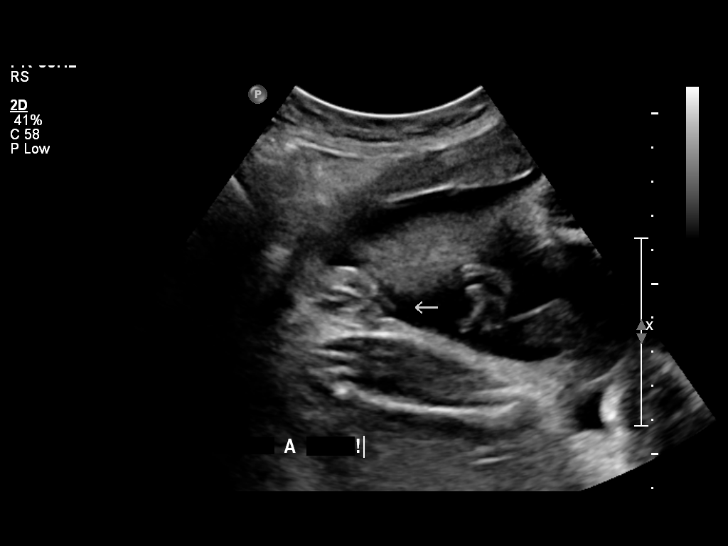
[im 17/41]
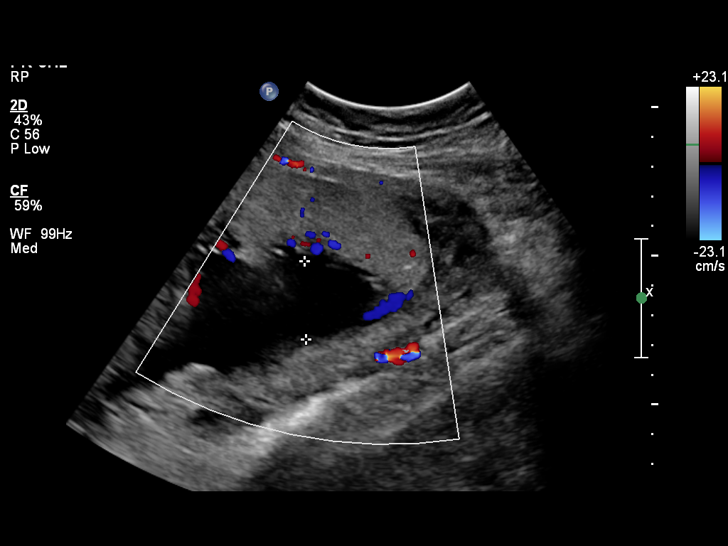
[im 20/41]
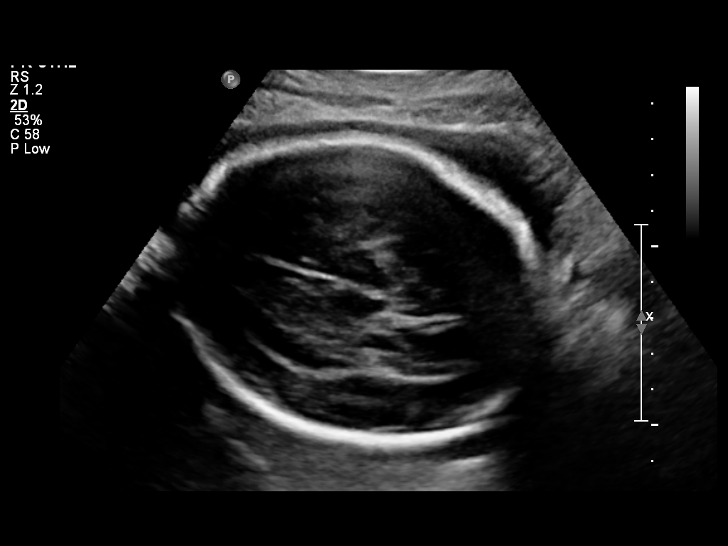
[im 23/41]
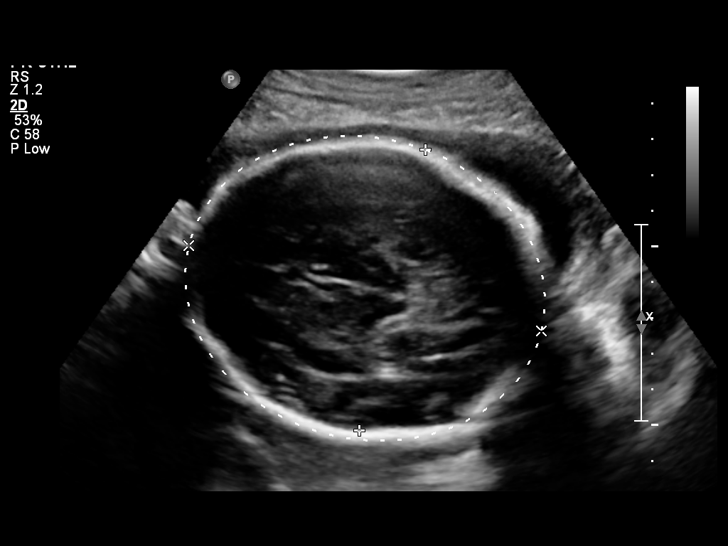
[im 26/41]
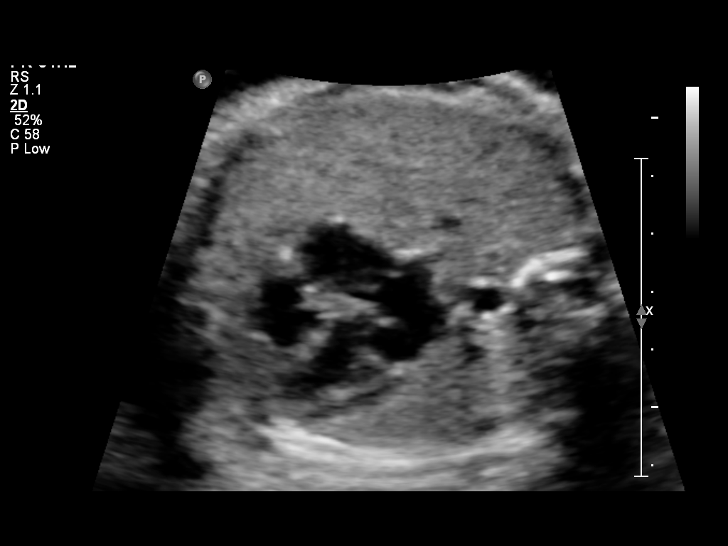
[im 29/41]
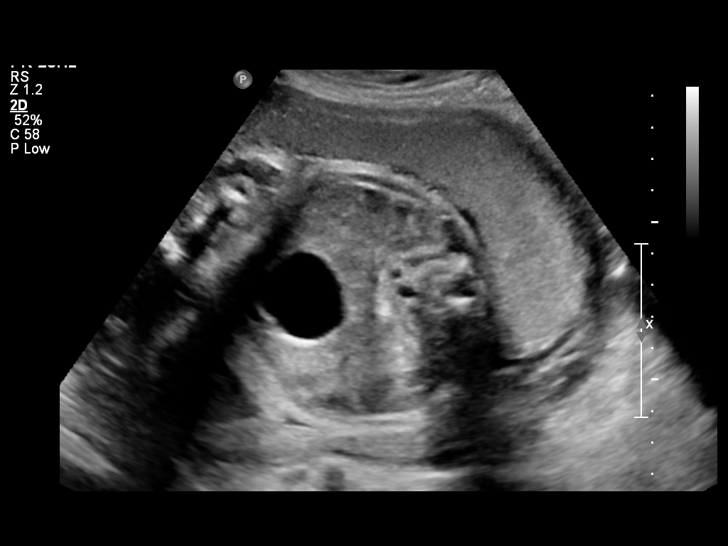
[im 32/41]
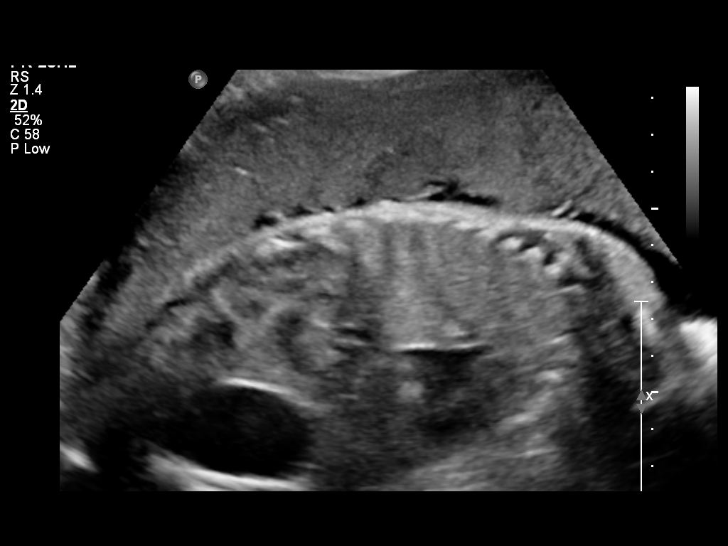
[im 35/41]
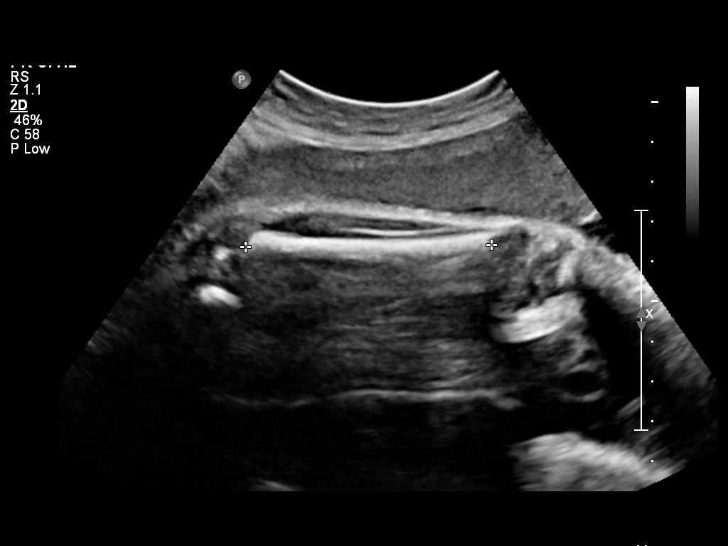
[im 38/41]
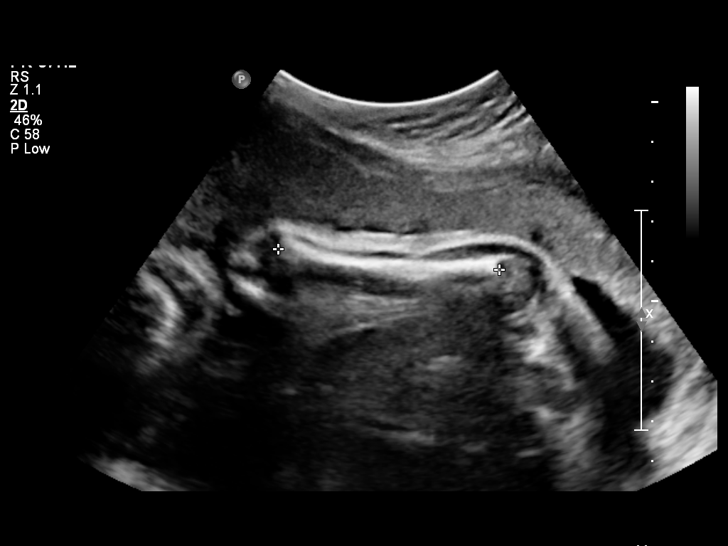
[im 41/41]
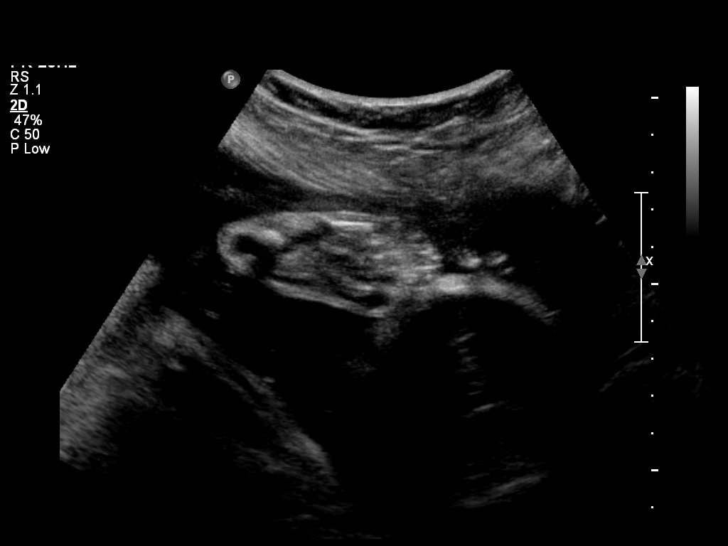

[14 of 28 positions shown; findings below may reference images not displayed]

IMPRESSION: See AS Obstetric US report.

## 2012-01-23 ENCOUNTER — Inpatient Hospital Stay (HOSPITAL_COMMUNITY)
Admission: AD | Admit: 2012-01-23 | Discharge: 2012-01-23 | Discharge status: Against Medical Advice | Payer: Medicare Other | Source: Ambulatory Visit | Attending: Obstetrics & Gynecology | Admitting: Obstetrics & Gynecology

## 2012-01-23 DIAGNOSIS — N949 Unspecified condition associated with female genital organs and menstrual cycle: Secondary | ICD-10-CM | POA: Insufficient documentation

## 2012-01-23 LAB — POCT PREGNANCY, URINE: Preg Test, Ur: NEGATIVE

## 2012-01-23 NOTE — MAU Note (Signed)
Pt not in lobby.  

## 2012-01-23 NOTE — MAU Note (Signed)
Patient states she has had a bad vaginal odor for about 2 weeks. States she has heavy periods and was using makeup sponges with her last period 2 weeks ago and is not sure if she took them out. Wants to be checked for STD's.

## 2013-03-12 ENCOUNTER — Telehealth: Payer: Self-pay | Admitting: Neurology

## 2013-03-12 NOTE — Telephone Encounter (Signed)
Disability letter for patient

## 2017-05-29 ENCOUNTER — Encounter: Payer: Self-pay | Admitting: Neurology

## 2017-05-29 ENCOUNTER — Telehealth: Payer: Self-pay

## 2017-05-29 ENCOUNTER — Ambulatory Visit (INDEPENDENT_AMBULATORY_CARE_PROVIDER_SITE_OTHER): Payer: Medicare HMO | Admitting: Neurology

## 2017-05-29 VITALS — BP 117/78 | Ht 63.0 in | Wt 111.8 lb

## 2017-05-29 DIAGNOSIS — G8110 Spastic hemiplegia affecting unspecified side: Secondary | ICD-10-CM | POA: Diagnosis not present

## 2017-05-29 MED ORDER — LEVETIRACETAM 500 MG PO TABS: 500.0000 mg | ORAL_TABLET | Freq: Four times a day (QID) | ORAL | 11 refills | Status: AC

## 2017-05-29 NOTE — Patient Instructions (Signed)
I had a long discussion with the patient and his her mother and daughter regarding her remote left MCA infarct in 2006 with residual mild aphasia moderately well and has been fairly functional including being able to drive. I have filled out her DMV paperwork to help her drive. I recommend she take aspirin 81 mg daily for stroke prevention continue Keppra and the current dose of 500 mg in the morning and 1000 g at night. She was counseled to be compliant with medications and to avoid seizure provoking stimuli like sleep deprivation and medication noncompliance. She will continue follow-up with her pain management physician in [place] and continue Botox injections and outpatient physical and occupational therapy. No scheduled follow-up appointment with me is necessary. spastic right hemiparesis.

## 2017-05-29 NOTE — Telephone Encounter (Signed)
DMV done by Dr.SEthi. Fee paid by patient. Patient given copy of the DMV form. Form sent to medical records for filling.

## 2017-05-29 NOTE — Progress Notes (Signed)
[facility] [address] [place]. [place] [phone]       OFFICE CONSULT NOTE  Deanna. [name] Date of Birth:  04/22/1981 Medical Record Number:  [id number]   Referring MD:  Adelene IdlerLinsey abrahams  Reason for Referral:  Remote stroke, seizures and driving  HPI: [name] is a 36 year old Caucasian lady who is known to me from previous large left MCA infarct in 2006. She is accompanied by her mother and daughter. Unfortunately I do not have access to my previous office and hospital records from 2006. 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 Etiology was felt to be cryptogenic. Likely had a PFO but I cannot find echo results in the computer. She was placed on aspirin. She had significant disability  With aphasia and spastic right hemiplegia. She was lost to follow-up and moved to [place] years and recently moved back to [place]. She also has history of seizures which started at age 89 for which she takes Keppra. She states her last seizure was in August 2012. And she has been very compliant with her medications She was apparently driving in [place] despite her spastic hemiplegia and renew her license many times. After moving back to [place] she was asked to get neurological assessment prior to getting her license back. She informs me that she did not have any accidents and was able to drive despite her spastic hemiplegia without any special equipment,. She has passed the written test as well as road test here in [place]. She sees a pain management physician Dr. Tonny [place]anson [place] forget[place] of Botox for pain and spasticity. She also takes Valium. She also takes Lexapro  And Prozac for anxiety depression. She was [facility] who referred her to me since I had seen her for her stroke 12 years ago. She denies any recurrent symptoms of stroke or TIA over the last  12 years.  ROS:   14 system review of systems is positive for  weakness, pain, gait difficulty, spasticity and all other systems negative  PMH:  Past Medical History:  Diagnosis Date  . Seizure (HCC)   . Stroke [facility](HCC)     Social History:  Social History   Socioeconomic History  . Marital status: Single    Spouse name: Not on file  . Number of children: Not on file  . Years of education: Not on file  . Highest education level: Not on file  Social Needs  . Financial resource strain: Not on file  . Food insecurity - worry: Not on file  . Food insecurity - inability: Not on file  . Transportation needs - medical: Not on file  . Transportation needs - non-medical: Not on file  Occupational History  . Not on file  Tobacco Use  . Smoking status: Current Every Day Smoker    Packs/day: 0.50  . Smokeless tobacco: Never Used  Substance and Sexual Activity  . Alcohol use: Yes    Alcohol/week: 0.6 oz    Types: 1 Cans of beer per week    Comment: occasional  beer  . Drug use: No  . Sexual activity: Not on file  Other Topics Concern  . Not on file  Social History Narrative  . Not on file    Medications:   Current Outpatient Medications on File Prior to Visit  Medication Sig Dispense Refill  . albuterol (PROVENTIL  HFA;VENTOLIN HFA) 108 (90 Base) MCG/ACT inhaler Inhale into the lungs.    Marland Kitchen buPROPion (WELLBUTRIN SR) 150 MG 12 hr tablet Take by mouth.    . diazepam (VALIUM) 5 MG tablet     . escitalopram (LEXAPRO) 20 MG tablet Take by mouth.    . fluticasone furoate-vilanterol (BREO ELLIPTA) 100-25 MCG/INH AEPB Inhale into the lungs.     No current facility-administered medications on file prior to visit.     Allergies:  No Known Allergies  Physical Exam General: Frail malnourished-looking young Caucasian lady seated, in no evident distress Head: head normocephalic and atraumatic.   Neck: supple with no carotid or supraclavicular bruits Cardiovascular: regular rate and  rhythm, no murmurs Musculoskeletal: no deformity Skin:  no rash/petichiae Vascular:  Normal pulses all extremities  Neurologic Exam Mental Status: Awake and fully alert. Oriented to place and time. Recent and remote memory intact. Attention span, concentration and fund of knowledge appropriate. Mood and affect appropriate. Mild expressive aphasia with some word hesitancy and word finding difficulties. Mild dysarthria. Good comprehension Cranial Nerves: Fundoscopic exam reveals sharp disc margins. Pupils equal, briskly reactive to light. Extraocular movements full without nystagmus. Visual fields full to confrontation. Hearing intact. Mild right lower facial weakness. Facial sensation intact. Face, tongue, palate moves normally and symmetrically.  Motor: Spastic right hemiplegia with right upper extremity 4/5 proximally but significant weakness and contracture of the right wrist and fingers. 4/5 proximally at the right hip and knees. Right ankle foot drop with ankle dorsiflexor weakness. Tone is significantly increase on the right compared to the left. Sensory.: intact to touch , pinprick , position and vibratory sensation.  Coordination: Rapid alternating movements normal in all extremities. Finger-to-nose and heel-to-shin performed accurately bilaterally. Gait and Station: Arises from chair without difficulty. Stance is normal. Gait demonstrates hemiplegic gait with circumduction with right foot drop  Reflexes: 2+ and asymmetric brisker on the right. Toes downgoing.   NIHSS  7 Modified Rankin 3  ASSESSMENT: 36 year old Caucasian lady with remote left hemispheric infarct in 2006 with left MCA embolus of cryptogenic etiology with residual mild expressive aphasia and spastic hemiplegia and post stroke seizures. She has done well over the years without recurrent seizures since 2012.    PLAN: I had a long discussion with the patient, mother and daughter regarding her remote large left brain stroke  with residual mild aphasia and spastic hemiplegia and post stroke seizures and answered questions. Patient had been driving without problems for years in [place] and has recently moved to [place] and required [facility] paperwork which I filled out. I recommend she stay on aspirin 81 mg daily for stroke prevention and continue Keppra and the current dose of 500 mg 4 times daily for seizure prevention. She is reluctant to  reducing her medication despite being seizure-free for 6 years I counseled her to the compliant with her medications and avoid seizure provoking stimuli. No routine scheduled follow-up appointment with me is necessary. Greater than 50% time during this 45 neck consultation visit was spent on discussing her remote cryptogenic stroke, residual spastic hemiplegia and her driving ability and seizure management strategy discussion She was advised to follow-up with her primary physician and pain management physician in [facility] Delia Heady, MD  [facility] [address] [place], [place] [address]  Phone [phone] Fax [phone] Note: This document was prepared with digital dictation and possible smart phrase technology. Any transcriptional errors that result from this process are unintentional.

## 2017-05-30 DIAGNOSIS — Z0289 Encounter for other administrative examinations: Secondary | ICD-10-CM

## 2018-06-25 ENCOUNTER — Telehealth: Payer: Self-pay | Admitting: Neurology

## 2018-06-25 NOTE — Telephone Encounter (Signed)
Patient needs her medical records please call .  [phone] .

## 2023-01-03 LAB — GLUCOSE, POCT (MANUAL RESULT ENTRY): Glucose Fasting, POC: 94 mg/dL (ref 70–99)

## 2023-01-03 NOTE — Progress Notes (Signed)
Pt has PCP at [facility] in [place]. Pt is fasting
